# Patient Record
Sex: Male | Born: 1980 | Race: White | Hispanic: No | Marital: Married | Smoking: Never smoker
Health system: Southern US, Community
[De-identification: ages and names within clinical notes are randomized; demographics above are authoritative.]

## PROBLEM LIST (undated history)

## (undated) DIAGNOSIS — I340 Nonrheumatic mitral (valve) insufficiency: Secondary | ICD-10-CM

## (undated) DIAGNOSIS — I351 Nonrheumatic aortic (valve) insufficiency: Secondary | ICD-10-CM

## (undated) HISTORY — DX: Nonrheumatic aortic (valve) insufficiency: I35.1

## (undated) HISTORY — DX: Nonrheumatic mitral (valve) insufficiency: I34.0

---

## 2015-07-22 ENCOUNTER — Encounter: Payer: Self-pay | Admitting: Unknown Physician Specialty

## 2015-07-26 DIAGNOSIS — I340 Nonrheumatic mitral (valve) insufficiency: Secondary | ICD-10-CM | POA: Insufficient documentation

## 2015-07-26 DIAGNOSIS — I351 Nonrheumatic aortic (valve) insufficiency: Secondary | ICD-10-CM

## 2015-07-29 ENCOUNTER — Encounter: Payer: Self-pay | Admitting: Unknown Physician Specialty

## 2015-07-29 ENCOUNTER — Ambulatory Visit (INDEPENDENT_AMBULATORY_CARE_PROVIDER_SITE_OTHER): Payer: 59 | Admitting: Unknown Physician Specialty

## 2015-07-29 VITALS — BP 123/76 | HR 74 | Temp 97.5°F | Ht 71.13 in | Wt 182.8 lb

## 2015-07-29 DIAGNOSIS — Z0000 Encounter for general adult medical examination without abnormal findings: Secondary | ICD-10-CM | POA: Diagnosis not present

## 2015-07-29 DIAGNOSIS — Z23 Encounter for immunization: Secondary | ICD-10-CM | POA: Diagnosis not present

## 2015-07-29 NOTE — Progress Notes (Signed)
   BP 123/76 mmHg  Pulse 74  Temp(Src) 97.5 F (36.4 C)  Ht 5' 11.13" (1.807 m)  Wt 182 lb 12.8 oz (82.918 kg)  BMI 25.39 kg/m2  SpO2 97%   Subjective:    Patient ID: Jordan Bentley, male    DOB: Jul 16, 1981, 34 y.o.   MRN: [id number]  HPI: Jordan Bentley is a 34 y.o. male  Chief Complaint  Patient presents with  . Annual Exam    Relevant past medical, surgical, family and social history reviewed and updated as indicated. Interim medical history since our last visit reviewed. Allergies and medications reviewed and updated.  Review of Systems  Constitutional: Negative.   HENT: Negative.   Eyes: Negative.   Respiratory: Negative.   Cardiovascular: Negative.   Gastrointestinal:       Frequent heartburn and takes TUMS and the occasional Zegrid.    Endocrine: Negative.   Genitourinary: Negative.   Skin: Negative.   Allergic/Immunologic: Negative.   Neurological: Negative.   Hematological: Negative.   Psychiatric/Behavioral: Negative.     Per HPI unless specifically indicated above     Objective:    BP 123/76 mmHg  Pulse 74  Temp(Src) 97.5 F (36.4 C)  Ht 5' 11.13" (1.807 m)  Wt 182 lb 12.8 oz (82.918 kg)  BMI 25.39 kg/m2  SpO2 97%  Wt Readings from Last 3 Encounters:  07/29/15 182 lb 12.8 oz (82.918 kg)  07/20/14 170 lb (77.111 kg)    Physical Exam  Constitutional: He is oriented to person, place, and time. He appears well-developed and well-nourished.  HENT:  Head: Normocephalic.  Eyes: Pupils are equal, round, and reactive to light.  Cardiovascular: Normal rate, regular rhythm and normal heart sounds.   Pulmonary/Chest: Effort normal.  Abdominal: Soft. Bowel sounds are normal.  Musculoskeletal: Normal range of motion.  Neurological: He is alert and oriented to person, place, and time. He has normal reflexes.  Skin: Skin is warm and dry.  Psychiatric: He has a normal mood and affect. His behavior is normal. Judgment and thought content normal.       Assessment & Plan:   Problem List Items Addressed This Visit    None    Visit Diagnoses    Need for influenza vaccination    -  Primary    Relevant Orders    Flu Vaccine QUAD 36+ mos PF IM (Fluarix & Fluzone Quad PF) (Completed)    Annual physical exam        Relevant Orders    CBC with Differential/Platelet    Comprehensive metabolic panel    HIV antibody    TSH    Lipid Panel w/o Chol/HDL Ratio        Follow up plan: Return if symptoms worsen or fail to improve.

## 2015-07-30 LAB — COMPREHENSIVE METABOLIC PANEL
A/G RATIO: 2 (ref 1.1–2.5)
ALT: 40 IU/L (ref 0–44)
AST: 21 IU/L (ref 0–40)
Albumin: 4.5 g/dL (ref 3.5–5.5)
Alkaline Phosphatase: 59 IU/L (ref 39–117)
BILIRUBIN TOTAL: 0.5 mg/dL (ref 0.0–1.2)
BUN/Creatinine Ratio: 13 (ref 8–19)
BUN: 13 mg/dL (ref 6–20)
CALCIUM: 9.3 mg/dL (ref 8.7–10.2)
CO2: 26 mmol/L (ref 18–29)
Chloride: 100 mmol/L (ref 97–108)
Creatinine, Ser: 0.99 mg/dL (ref 0.76–1.27)
GFR, EST AFRICAN AMERICAN: 116 mL/min/{1.73_m2} (ref >59)
GFR, EST NON AFRICAN AMERICAN: 100 mL/min/{1.73_m2} (ref >59)
GLOBULIN, TOTAL: 2.3 g/dL (ref 1.5–4.5)
Glucose: 103 mg/dL — ABNORMAL HIGH (ref 65–99)
POTASSIUM: 3.8 mmol/L (ref 3.5–5.2)
SODIUM: 141 mmol/L (ref 134–144)
Total Protein: 6.8 g/dL (ref 6.0–8.5)

## 2015-07-30 LAB — LIPID PANEL W/O CHOL/HDL RATIO
Cholesterol, Total: 142 mg/dL (ref 100–199)
HDL: 47 mg/dL (ref >39)
LDL CALC: 65 mg/dL (ref 0–99)
Triglycerides: 148 mg/dL (ref 0–149)
VLDL CHOLESTEROL CAL: 30 mg/dL (ref 5–40)

## 2015-07-30 LAB — CBC WITH DIFFERENTIAL/PLATELET
BASOS: 1 %
Basophils Absolute: 0 10*3/uL (ref 0.0–0.2)
EOS (ABSOLUTE): 0.3 10*3/uL (ref 0.0–0.4)
Eos: 6 %
Hematocrit: 42.6 % (ref 37.5–51.0)
Hemoglobin: 15.1 g/dL (ref 12.6–17.7)
IMMATURE GRANULOCYTES: 0 %
Immature Grans (Abs): 0 10*3/uL (ref 0.0–0.1)
Lymphocytes Absolute: 2.2 10*3/uL (ref 0.7–3.1)
Lymphs: 40 %
MCH: 31 pg (ref 26.6–33.0)
MCHC: 35.4 g/dL (ref 31.5–35.7)
MCV: 88 fL (ref 79–97)
MONOS ABS: 0.4 10*3/uL (ref 0.1–0.9)
Monocytes: 8 %
NEUTROS PCT: 45 %
Neutrophils Absolute: 2.5 10*3/uL (ref 1.4–7.0)
Platelets: 194 10*3/uL (ref 150–379)
RBC: 4.87 x10E6/uL (ref 4.14–5.80)
RDW: 12.1 % — AB (ref 12.3–15.4)
WBC: 5.5 10*3/uL (ref 3.4–10.8)

## 2015-07-30 LAB — TSH: TSH: 2.84 u[IU]/mL (ref 0.450–4.500)

## 2015-07-30 LAB — HIV ANTIBODY (ROUTINE TESTING W REFLEX): HIV Screen 4th Generation wRfx: NONREACTIVE

## 2015-08-01 ENCOUNTER — Encounter: Payer: Self-pay | Admitting: Unknown Physician Specialty

## 2015-12-09 ENCOUNTER — Emergency Department
Admission: EM | Admit: 2015-12-09 | Discharge: 2015-12-09 | Disposition: A | Discharge status: Home / Self Care | Payer: 59 | Attending: Emergency Medicine | Admitting: Emergency Medicine

## 2015-12-09 ENCOUNTER — Encounter: Payer: Self-pay | Admitting: Emergency Medicine

## 2015-12-09 ENCOUNTER — Emergency Department: Payer: 59

## 2015-12-09 DIAGNOSIS — S46911A Strain of unspecified muscle, fascia and tendon at shoulder and upper arm level, right arm, initial encounter: Secondary | ICD-10-CM | POA: Insufficient documentation

## 2015-12-09 DIAGNOSIS — Z79899 Other long term (current) drug therapy: Secondary | ICD-10-CM | POA: Diagnosis not present

## 2015-12-09 DIAGNOSIS — Y9344 Activity, trampolining: Secondary | ICD-10-CM | POA: Insufficient documentation

## 2015-12-09 DIAGNOSIS — S4991XA Unspecified injury of right shoulder and upper arm, initial encounter: Secondary | ICD-10-CM | POA: Diagnosis present

## 2015-12-09 DIAGNOSIS — W1789XA Other fall from one level to another, initial encounter: Secondary | ICD-10-CM | POA: Diagnosis not present

## 2015-12-09 DIAGNOSIS — Y998 Other external cause status: Secondary | ICD-10-CM | POA: Insufficient documentation

## 2015-12-09 DIAGNOSIS — Y9289 Other specified places as the place of occurrence of the external cause: Secondary | ICD-10-CM | POA: Diagnosis not present

## 2015-12-09 MED ORDER — HYDROCODONE-ACETAMINOPHEN 5-325 MG PO TABS
1.0000 | ORAL_TABLET | Freq: Once | ORAL | Status: AC
  Administered 2015-12-09: 1 via ORAL
  Filled 2015-12-09: qty 1

## 2015-12-09 MED ORDER — HYDROCODONE-ACETAMINOPHEN 5-325 MG PO TABS: 1.0000 | ORAL_TABLET | ORAL | Status: AC | PRN

## 2015-12-09 MED ORDER — IBUPROFEN 800 MG PO TABS
800.0000 mg | ORAL_TABLET | Freq: Once | ORAL | Status: AC
  Administered 2015-12-09: 800 mg via ORAL
  Filled 2015-12-09: qty 1

## 2015-12-09 MED ORDER — IBUPROFEN 800 MG PO TABS: 800.0000 mg | ORAL_TABLET | Freq: Three times a day (TID) | ORAL | Status: AC | PRN

## 2015-12-09 NOTE — ED Provider Notes (Signed)
[facility] Emergency Department Provider Note  ____________________________________________  Time seen: Approximately 10:07 PM  I have reviewed the triage vital signs and the nursing notes.   HISTORY  Chief Complaint Shoulder Pain    HPI Jordan Bentley is a 35 y.o. male who fell approximately 3 hours ago while jumping on trampoline, and landing on his right shoulder. He has pain with movement. No deformity. No wrist pain. No neck pain.   Past Medical History  Diagnosis Date  . Aortic regurgitation   . Mitral regurgitation     Patient Active Problem List   Diagnosis Date Noted  . Aortic regurgitation 07/26/2015  . Mitral regurgitation 07/26/2015    History reviewed. No pertinent past surgical history.  Current Outpatient Rx  Name  Route  Sig  Dispense  Refill  . HYDROcodone-acetaminophen (NORCO) 5-325 MG tablet   Oral   Take 1 tablet by mouth every 4 (four) hours as needed for moderate pain.   20 tablet   0   . ibuprofen (ADVIL,MOTRIN) 800 MG tablet   Oral   Take 1 tablet (800 mg total) by mouth every 8 (eight) hours as needed.   15 tablet   0   . Omeprazole-Sodium Bicarbonate (ZEGERID) 20-1100 MG CAPS capsule   Oral   Take 1 capsule by mouth daily before breakfast.           Allergies Review of patient's allergies indicates no known allergies.  Family History  Problem Relation Age of Onset  . Diabetes Brother   . Diabetes Maternal Grandmother   . Cancer Paternal Grandfather     prostate    Social History Social History  Substance Use Topics  . Smoking status: Never Smoker   . Smokeless tobacco: Never Used  . Alcohol Use: No    Review of Systems Constitutional: No fever/chills Eyes: No visual changes. ENT: No sore throat. Cardiovascular: Denies chest pain. Respiratory: Denies shortness of breath. Gastrointestinal: No abdominal pain.  No nausea, no vomiting.  No diarrhea.  No constipation. Genitourinary: Negative for  dysuria. Musculoskeletal: Negative for back pain. Skin: Negative for rash. Neurological: Negative for headaches, focal weakness or numbness. 10-point ROS otherwise negative.  ____________________________________________   PHYSICAL EXAM:  VITAL SIGNS: ED Triage Vitals  Enc Vitals Group     BP 12/09/15 2125 138/87 mmHg     Pulse Rate 12/09/15 2125 90     Resp 12/09/15 2125 18     Temp 12/09/15 2125 98.4 F (36.9 C)     Temp Source 12/09/15 2125 Oral     SpO2 12/09/15 2125 98 %     Weight 12/09/15 2125 178 lb (80.74 kg)     Height 12/09/15 2125  (1.88 m)     Head Cir --      Peak Flow --      Pain Score 12/09/15 2126 5     Pain Loc --      Pain Edu? --      Excl. in GC? --     Constitutional: Alert and oriented. Well appearing and in no acute distress. Eyes: Conjunctivae are normal.  EOMI. Head: Atraumatic. Nose: No congestion/rhinnorhea. Neck:  Supple.  No adenopathy.   Respiratory: Normal respiratory effort.   Musculoskeletal: right shoulder:  Minimal deltoid tenderness. Nontender over the before meals joint. Normal-appearing, symmetric clavicle. Limited range of motion due to pain. Pain with supraspinatus test. Pain with impingement testing. Neurologic:  Normal speech and language. No gross focal neurologic deficits are  appreciated. No gait instability. Skin:  Skin is warm, dry and intact. No rash noted. Psychiatric: Mood and affect are normal. Speech and behavior are normal.  ____________________________________________   LABS (all labs ordered are listed, but only abnormal results are displayed)  Labs Reviewed - No data to display ____________________________________________  EKG   ____________________________________________  RADIOLOGY  CLINICAL DATA: Fall from trampoline with right shoulder pain, initial encounter  EXAM: RIGHT SHOULDER - 2+ VIEW  COMPARISON: None.  FINDINGS: There is no evidence of fracture or dislocation. There is  no evidence of arthropathy or other focal bone abnormality. Soft tissues are unremarkable.  IMPRESSION: No acute abnormality noted.   Electronically Signed  By: Alcide Clever M.D.  On: 12/09/2015 21:58 ____________________________________________   PROCEDURES  Procedure(s) performed: None  Critical Care performed: No  ____________________________________________   INITIAL IMPRESSION / ASSESSMENT AND PLAN / ED COURSE  Pertinent labs & imaging results that were available during my care of the patient were reviewed by me and considered in my medical decision making (see chart for details).  35 year old male with fall and injury to the right shoulder. X-ray negative as above. Concern for rotator cuff injury. Given a sling, ibuprofen and Norco. He will begin exercises in 2-3 days. Handout given. Can follow-up with orthopedics if not improving.  Continue ice to the area. ____________________________________________   FINAL CLINICAL IMPRESSION(S) / ED DIAGNOSES  Final diagnoses:  Shoulder strain, right, initial encounter      Ignacia Bayley, PA-C 12/09/15 2246  Minna Antis, MD 12/09/15 [phone]

## 2015-12-09 NOTE — Discharge Instructions (Signed)
Muscle Strain A muscle strain (pulled muscle) happens when a muscle is stretched beyond normal length. It happens when a sudden, violent force stretches your muscle too far. Usually, a few of the fibers in your muscle are torn. Muscle strain is common in athletes. Recovery usually takes 1-2 weeks. Complete healing takes 5-6 weeks.  HOME CARE   Follow the PRICE method of treatment to help your injury get better. Do this the first 2-3 days after the injury:  Protect. Protect the muscle to keep it from getting injured again.  Rest. Limit your activity and rest the injured body part.  Ice. Put ice in a plastic bag. Place a towel between your skin and the bag. Then, apply the ice and leave it on from 15-20 minutes each hour. After the third day, switch to moist heat packs.  Compression. Use a splint or elastic bandage on the injured area for comfort. Do not put it on too tightly.  Elevate. Keep the injured body part above the level of your heart.  Only take medicine as told by your doctor.  Warm up before doing exercise to prevent future muscle strains. GET HELP IF:   You have more pain or puffiness (swelling) in the injured area.  You feel numbness, tingling, or notice a loss of strength in the injured area. MAKE SURE YOU:   Understand these instructions.  Will watch your condition.  Will get help right away if you are not doing well or get worse.   This information is not intended to replace advice given to you by your health care provider. Make sure you discuss any questions you have with your health care provider.   Document Released: 07/10/2008 Document Revised: 07/22/2013 Document Reviewed: 04/30/2013 Elsevier Interactive Patient Education 2016 [employer]   Continue to ice the right shoulder. Use ibuprofen for pain and swelling.  Follow-up with the orthopedist next week if not improving.

## 2015-12-09 NOTE — ED Notes (Addendum)
Patient fell off a trampoline and landed on his right shoulder on a mat. Patient with right shoulder pain. Patient with limited ROM.

## 2017-03-15 IMAGING — CR DG SHOULDER 2+V*R*
3 series · 4 of 4 positions shown · non-contrast
Comparison: None.

CLINICAL DATA: Fall from trampoline with right shoulder pain,
initial encounter

EXAM:
RIGHT SHOULDER - 2+ VIEW

[shoulder grashey]
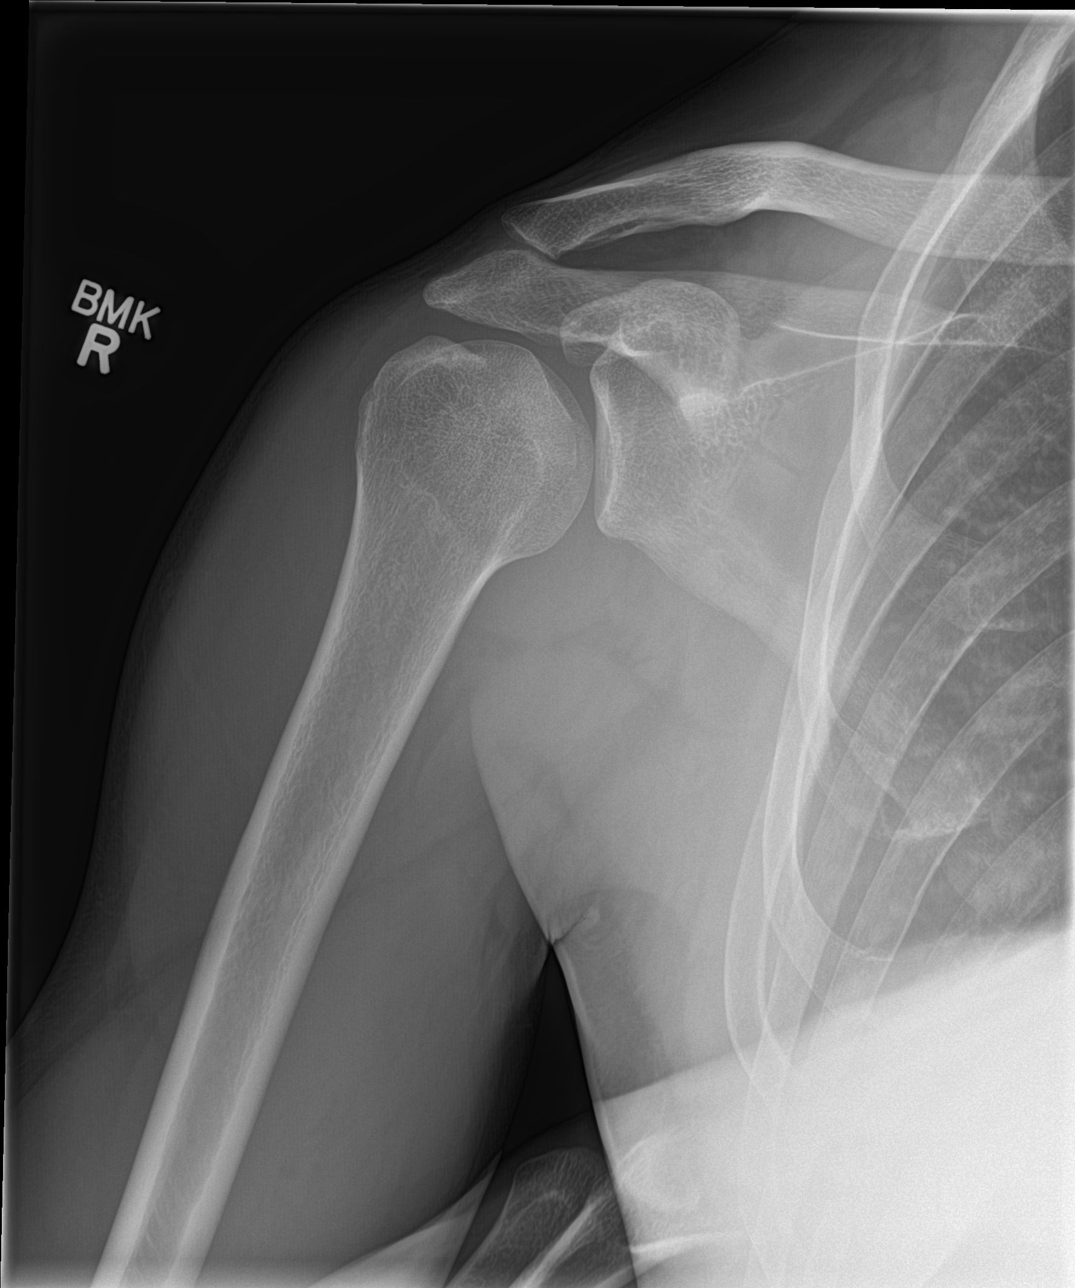

[shoulder y view]
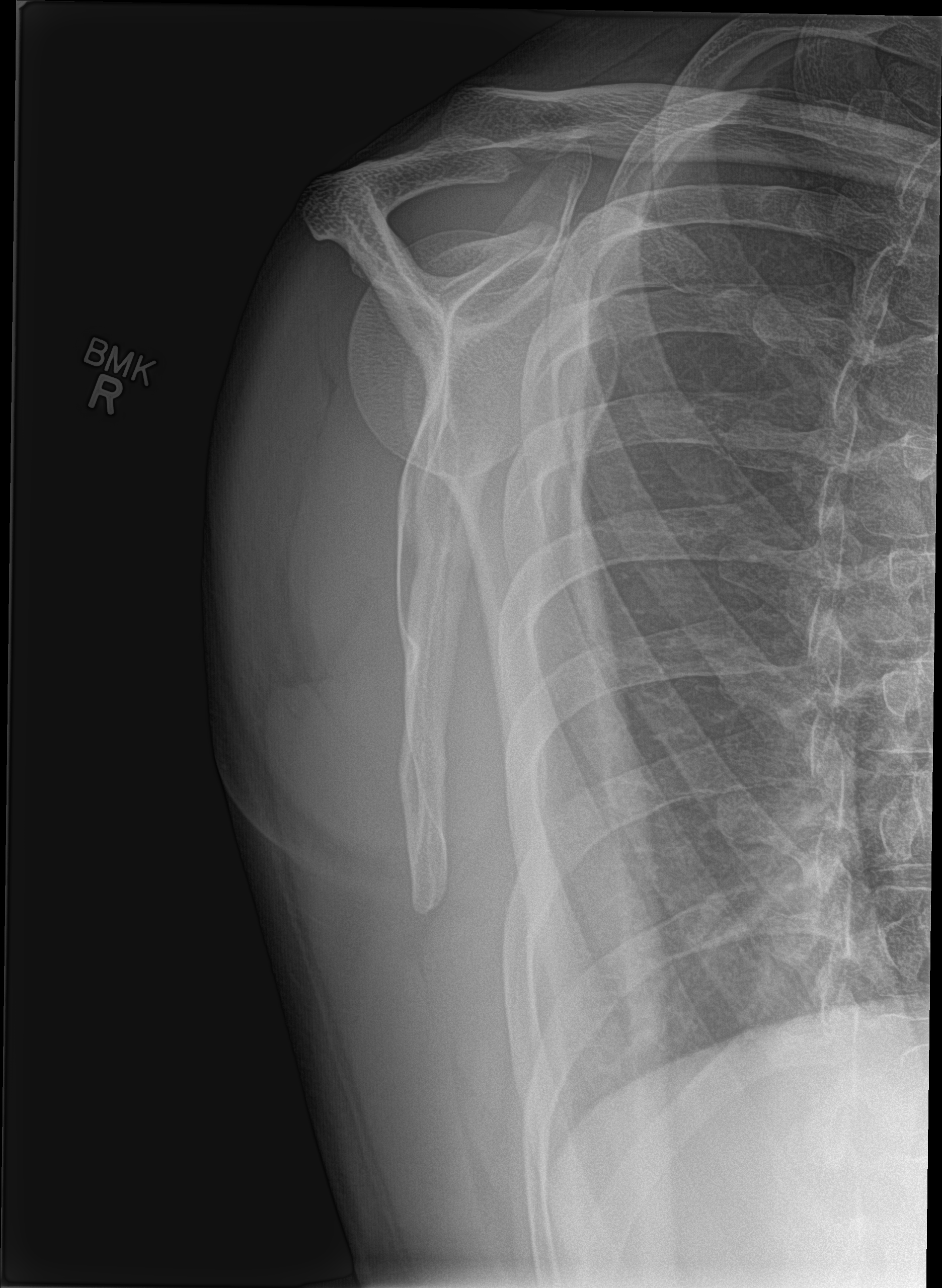

[Series 3: shoulder axillary · 0.14mm/px · 2 of 2 slices shown]
[im 1/2]
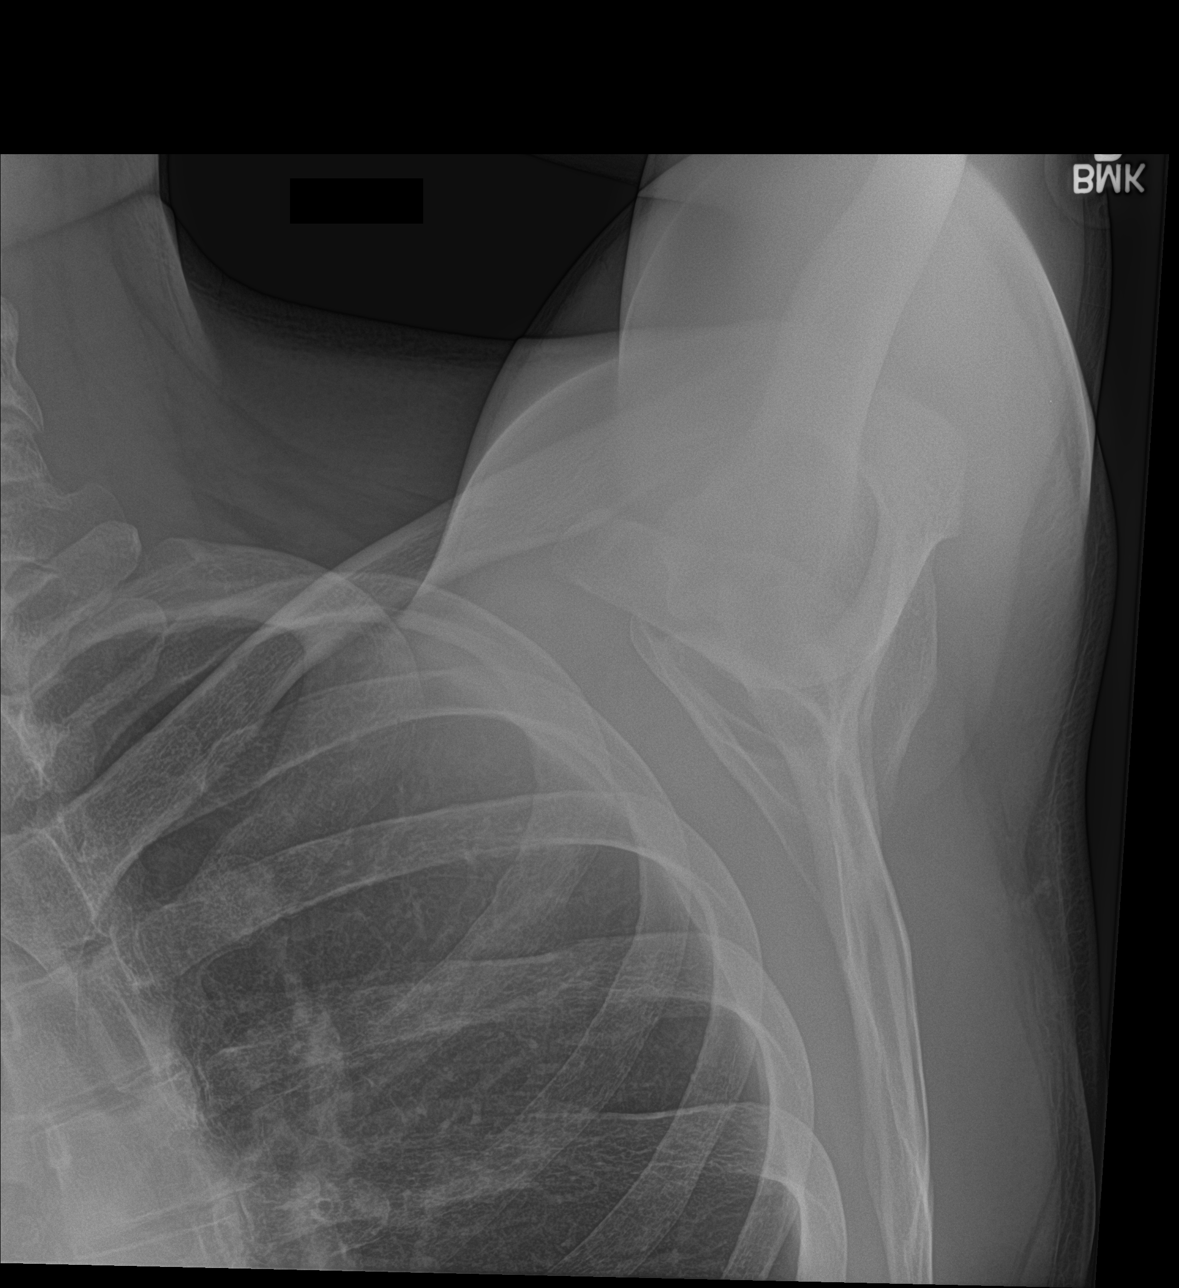
[im 2/2]
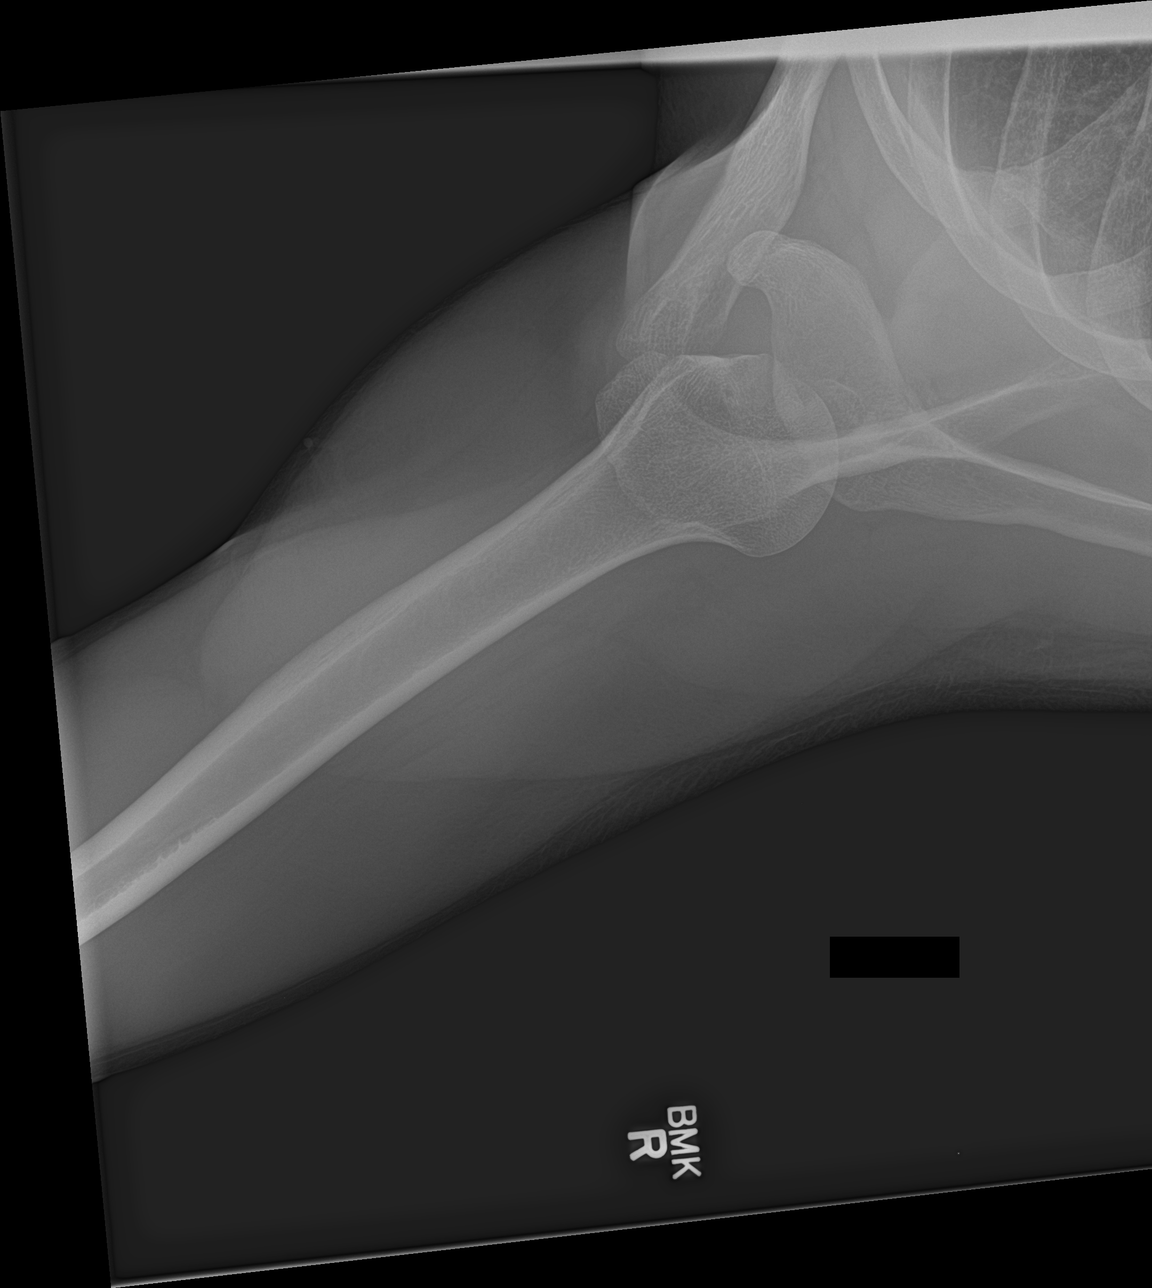

[4 of 4 positions shown; findings below may reference images not displayed]

FINDINGS: There is no evidence of fracture or dislocation. There is no
evidence of arthropathy or other focal bone abnormality. Soft
tissues are unremarkable.
IMPRESSION: No acute abnormality noted.

## 2020-03-16 ENCOUNTER — Emergency Department
Admission: EM | Admit: 2020-03-16 | Discharge: 2020-03-16 | Disposition: A | Discharge status: Home / Self Care | Payer: BC Managed Care – PPO | Attending: Student in an Organized Health Care Education/Training Program | Admitting: Student in an Organized Health Care Education/Training Program

## 2020-03-16 ENCOUNTER — Other Ambulatory Visit: Payer: Self-pay

## 2020-03-16 DIAGNOSIS — R42 Dizziness and giddiness: Secondary | ICD-10-CM | POA: Diagnosis present

## 2020-03-16 DIAGNOSIS — F439 Reaction to severe stress, unspecified: Secondary | ICD-10-CM | POA: Insufficient documentation

## 2020-03-16 LAB — BASIC METABOLIC PANEL
Anion gap: 9 (ref 5–15)
BUN: 12 mg/dL (ref 6–20)
CO2: 30 mmol/L (ref 22–32)
Calcium: 9.1 mg/dL (ref 8.9–10.3)
Chloride: 101 mmol/L (ref 98–111)
Creatinine, Ser: 1.1 mg/dL (ref 0.61–1.24)
GFR calc Af Amer: >60 mL/min (ref >60)
GFR calc non Af Amer: >60 mL/min (ref >60)
Glucose, Bld: 94 mg/dL (ref 70–99)
Potassium: 3.8 mmol/L (ref 3.5–5.1)
Sodium: 140 mmol/L (ref 135–145)

## 2020-03-16 LAB — URINALYSIS, COMPLETE (UACMP) WITH MICROSCOPIC
Bacteria, UA: NONE SEEN
Bilirubin Urine: NEGATIVE
Glucose, UA: NEGATIVE mg/dL
Hgb urine dipstick: NEGATIVE
Ketones, ur: NEGATIVE mg/dL
Leukocytes,Ua: NEGATIVE
Nitrite: NEGATIVE
Protein, ur: NEGATIVE mg/dL
Specific Gravity, Urine: 1.014 (ref 1.005–1.030)
Squamous Epithelial / LPF: NONE SEEN (ref 0–5)
pH: 8 (ref 5.0–8.0)

## 2020-03-16 LAB — TROPONIN I (HIGH SENSITIVITY): Troponin I (High Sensitivity): 3 ng/L (ref <18)

## 2020-03-16 LAB — CBC
HCT: 44.7 % (ref 39.0–52.0)
Hemoglobin: 16.3 g/dL (ref 13.0–17.0)
MCH: 31.3 pg (ref 26.0–34.0)
MCHC: 36.5 g/dL — ABNORMAL HIGH (ref 30.0–36.0)
MCV: 85.8 fL (ref 80.0–100.0)
Platelets: 176 10*3/uL (ref 150–400)
RBC: 5.21 MIL/uL (ref 4.22–5.81)
RDW: 11.6 % (ref 11.5–15.5)
WBC: 6 10*3/uL (ref 4.0–10.5)
nRBC: 0 % (ref 0.0–0.2)

## 2020-03-16 LAB — TSH: TSH: 1.69 u[IU]/mL (ref 0.350–4.500)

## 2020-03-16 MED ORDER — SODIUM CHLORIDE 0.9% FLUSH: 3.0000 mL | Freq: Once | INTRAVENOUS | Status: DC

## 2020-03-16 NOTE — ED Provider Notes (Signed)
[facility] Emergency Department Provider Note    First MD Initiated Contact with Patient 03/16/20 1551     (approximate)  I have reviewed the triage vital signs and the nursing notes.   HISTORY  Chief Complaint Dizziness    HPI Jordan Bentley is a 39 y.o. male   with the below's past medical history presents to the ER for evaluation of generalized heaviness and stress for the past several days.  Patient states he has been going under quite a bit of stressors at home with recent job change moving his family to the area.  He denies any chest pain or pressure.  No shortness of breath.  No nausea or vomiting.  No numbness or tingling.  States that he feels intermittently dizzy foggy headed.  Denies any headaches.  Has never been on any medication for this.  Denies any syncopal episodes or near syncopal episodes.  States he feels like he might have been overexerting himself during the move but also has been feeling significant stress is wondering if this could be related to some anxiety.   Past Medical History:  Diagnosis Date  . Aortic regurgitation   . Mitral regurgitation    Family History  Problem Relation Age of Onset  . Diabetes Brother   . Diabetes Maternal Grandmother   . Cancer Paternal Grandfather        prostate   History reviewed. No pertinent surgical history. Patient Active Problem List   Diagnosis Date Noted  . Aortic regurgitation 07/26/2015  . Mitral regurgitation 07/26/2015      Prior to Admission medications   Medication Sig Start Date End Date Taking? Authorizing Provider  HYDROcodone-acetaminophen (NORCO) 5-325 MG tablet Take 1 tablet by mouth every 4 (four) hours as needed for moderate pain. Patient not taking: Reported on 03/16/2020 12/09/15   Ignacia Bayley, PA-C  ibuprofen (ADVIL,MOTRIN) 800 MG tablet Take 1 tablet (800 mg total) by mouth every 8 (eight) hours as needed. Patient not taking: Reported on 03/16/2020 12/09/15   Ignacia Bayley, PA-C  Omeprazole-Sodium Bicarbonate (ZEGERID) 20-1100 MG CAPS capsule Take 1 capsule by mouth daily before breakfast.    [provider]    Allergies Patient has no known allergies.    Social History Social History   Tobacco Use  . Smoking status: Never Smoker  . Smokeless tobacco: Never Used  Substance Use Topics  . Alcohol use: No    Alcohol/week: 0.0 standard drinks  . Drug use: No    Review of Systems Patient denies headaches, rhinorrhea, blurry vision, numbness, shortness of breath, chest pain, edema, cough, abdominal pain, nausea, vomiting, diarrhea, dysuria, fevers, rashes or hallucinations unless otherwise stated above in HPI. ____________________________________________   PHYSICAL EXAM:  VITAL SIGNS: Vitals:   03/16/20 1356 03/16/20 1359  BP: (!) 157/76   Pulse: 85   Resp: 16   Temp:  98.4 F (36.9 C)  SpO2: 100%     Constitutional: Alert and oriented.  Eyes: Conjunctivae are normal.  Head: Atraumatic. Nose: No congestion/rhinnorhea. Mouth/Throat: Mucous membranes are moist.   Neck: No stridor. Painless ROM.  Cardiovascular: Normal rate, regular rhythm. Grossly normal heart sounds.  Good peripheral circulation. Respiratory: Normal respiratory effort.  No retractions. Lungs CTAB. Gastrointestinal: Soft and nontender. No distention. No abdominal bruits. No CVA tenderness. Genitourinary:  Musculoskeletal: No lower extremity tenderness nor edema.  No joint effusions. Neurologic: CN- intact.  No facial droop, Normal FNF.  Normal heel to shin.  Sensation intact bilaterally. Normal  speech and language. No gross focal neurologic deficits are appreciated. No gait instability.  Skin:  Skin is warm, dry and intact. No rash noted. Psychiatric: Mood and affect are normal. Speech and behavior are normal.  ____________________________________________   LABS (all labs ordered are listed, but only abnormal results are displayed)  Results for orders  placed or performed during the hospital encounter of 03/16/20 (from the past 24 hour(s))  Basic metabolic panel     Status: None   Collection Time: 03/16/20  2:03 PM  Result Value Ref Range   Sodium 140 135 - 145 mmol/L   Potassium 3.8 3.5 - 5.1 mmol/L   Chloride 101 98 - 111 mmol/L   CO2 30 22 - 32 mmol/L   Glucose, Bld 94 70 - 99 mg/dL   BUN 12 6 - 20 mg/dL   Creatinine, Ser 1.10 0.61 - 1.24 mg/dL   Calcium 9.1 8.9 - 10.3 mg/dL   GFR calc non Af Amer >60 >60 mL/min   GFR calc Af Amer >60 >60 mL/min   Anion gap 9 5 - 15  CBC     Status: Abnormal   Collection Time: 03/16/20  2:03 PM  Result Value Ref Range   WBC 6.0 4.0 - 10.5 K/uL   RBC 5.21 4.22 - 5.81 MIL/uL   Hemoglobin 16.3 13.0 - 17.0 g/dL   HCT 44.7 39.0 - 52.0 %   MCV 85.8 80.0 - 100.0 fL   MCH 31.3 26.0 - 34.0 pg   MCHC 36.5 (H) 30.0 - 36.0 g/dL   RDW 11.6 11.5 - 15.5 %   Platelets 176 150 - 400 K/uL   nRBC 0.0 0.0 - 0.2 %  Urinalysis, Complete w Microscopic     Status: Abnormal   Collection Time: 03/16/20  2:03 PM  Result Value Ref Range   Color, Urine YELLOW (A) YELLOW   APPearance HAZY (A) CLEAR   Specific Gravity, Urine 1.014 1.005 - 1.030   pH 8.0 5.0 - 8.0   Glucose, UA NEGATIVE NEGATIVE mg/dL   Hgb urine dipstick NEGATIVE NEGATIVE   Bilirubin Urine NEGATIVE NEGATIVE   Ketones, ur NEGATIVE NEGATIVE mg/dL   Protein, ur NEGATIVE NEGATIVE mg/dL   Nitrite NEGATIVE NEGATIVE   Leukocytes,Ua NEGATIVE NEGATIVE   RBC / HPF 0-5 0 - 5 RBC/hpf   WBC, UA 0-5 0 - 5 WBC/hpf   Bacteria, UA NONE SEEN NONE SEEN   Squamous Epithelial / LPF NONE SEEN 0 - 5   Mucus PRESENT    Amorphous Crystal PRESENT   TSH     Status: None   Collection Time: 03/16/20  2:03 PM  Result Value Ref Range   TSH 1.690 0.350 - 4.500 uIU/mL  Troponin I (High Sensitivity)     Status: None   Collection Time: 03/16/20  2:03 PM  Result Value Ref Range   Troponin I (High Sensitivity) 3 <18 ng/L    ____________________________________________  EKG My review and personal interpretation at Time: 13:54   Indication: dizziness  Rate: 80  Rhythm: sinus Axis: normal Other: normal intervals, no stemi ____________________________________________  RADIOLOGY  I personally reviewed all radiographic images ordered to evaluate for the above acute complaints and reviewed radiology reports and findings.  These findings were personally discussed with the patient.  Please see medical record for radiology report.  ____________________________________________   PROCEDURES  Procedure(s) performed:  Procedures    Critical Care performed: no ____________________________________________   INITIAL IMPRESSION / ASSESSMENT AND PLAN / ED COURSE  Pertinent  labs & imaging results that were available during my care of the patient were reviewed by me and considered in my medical decision making (see chart for details).   DDX: Dehydration, dysrhythmia, CHF, hypothyroid, stress reaction, adjustment disorder  Tomy Khim is a 39 y.o. who presents to the ED with symptoms as described above.  Patient is exceedingly well-appearing with reassuring exam.  Blood work is reassuring.  Will add on troponin as well as TSH to further evaluate.  I do suspect a component of stress contributing his presentation will observe in the ER.  Does not seem consistent with CNS pathology.   Clinical Course as of Mar 16 1754  Wed Mar 16, 2020  1716 Patient reassessed. Remains hemodynamically stable. No acute distress. Work-up is reassuring. Do suspect some component of stress contributing to his symptoms. We discussed close outpatient follow-up conservative measures and signs and symptoms which she should return to the ER.   [PR]    Clinical Course User Index [PR] Willy Eddy, MD    The patient was evaluated in Emergency Department today for the symptoms described in the history of present illness. He/she was  evaluated in the context of the global COVID-19 pandemic, which necessitated consideration that the patient might be at risk for infection with the SARS-CoV-2 virus that causes COVID-19. Institutional protocols and algorithms that pertain to the evaluation of patients at risk for COVID-19 are in a state of rapid change based on information released by regulatory bodies including the CDC and federal and state organizations. These policies and algorithms were followed during the patient's care in the ED.  As part of my medical decision making, I reviewed the following data within the electronic MEDICAL RECORD NUMBER Nursing notes reviewed and incorporated, Labs reviewed, notes from prior ED visits and [place] Controlled Substance Database   ____________________________________________   FINAL CLINICAL IMPRESSION(S) / ED DIAGNOSES  Final diagnoses:  Dizziness  Stress      NEW MEDICATIONS STARTED DURING THIS VISIT:  New Prescriptions   No medications on file     Note:  This document was prepared using Dragon voice recognition software and may include unintentional dictation errors.    Willy Eddy, MD 03/16/20 1755

## 2020-03-16 NOTE — ED Triage Notes (Signed)
Pt c/o having periods of feeling lightheaded/dizzy recently. States at first he was contributing it to a lot stressors of moving and job change. Pt is a/ox4 with a steady gait on arrival .

## 2020-03-16 NOTE — Discharge Instructions (Addendum)
Please continue to drink plenty of fluids.  Follow up with PCP.  Return for any pain, numbness, tingling, weakness, chest pain or shortness of breath.

## 2020-05-30 ENCOUNTER — Ambulatory Visit: Payer: Self-pay | Attending: Internal Medicine

## 2020-05-30 DIAGNOSIS — Z23 Encounter for immunization: Secondary | ICD-10-CM

## 2020-05-30 NOTE — Progress Notes (Signed)
   Covid-19 Vaccination Clinic  Name:  Jordan Bentley    MRN: [id number] DOB: [birth year]  05/30/2020  [name] was observed post Covid-19 immunization for 15 minutes without incident. He was provided with Vaccine Information Sheet and instruction to access the V-Safe system.   [name] was instructed to call 911 with any severe reactions post vaccine: Marland Kitchen Difficulty breathing  . Swelling of face and throat  . A fast heartbeat  . A bad rash all over body  . Dizziness and weakness   Immunizations Administered    Name Date Dose VIS Date Route   Moderna COVID-19 Vaccine 05/30/2020  9:20 AM 0.5 mL 09/2019 Intramuscular   Manufacturer: Moderna   Lot: [id number]   NDC: 14782-956-21

## 2020-07-04 ENCOUNTER — Ambulatory Visit: Payer: Self-pay

## 2020-07-04 ENCOUNTER — Ambulatory Visit: Payer: Managed Care, Other (non HMO) | Attending: Internal Medicine

## 2020-07-04 DIAGNOSIS — Z23 Encounter for immunization: Secondary | ICD-10-CM

## 2020-07-04 NOTE — Progress Notes (Signed)
   Covid-19 Vaccination Clinic  Name:  Jordan Bentley    MRN: [id number] DOB: 05/25/1981  07/04/2020  [name] was observed post Covid-19 immunization for 15 minutes without incident. He was provided with Vaccine Information Sheet and instruction to access the V-Safe system.   [name] was instructed to call 911 with any severe reactions post vaccine: Marland Kitchen Difficulty breathing  . Swelling of face and throat  . A fast heartbeat  . A bad rash all over body  . Dizziness and weakness   Immunizations Administered    Name Date Dose VIS Date Route   Moderna COVID-19 Vaccine 07/04/2020 10:01 AM 0.5 mL 09/2019 Intramuscular   Manufacturer: Moderna   Lot: [id number]   NDC: 50518-335-82
